# Patient Record
Sex: Male | Born: 1975 | Race: Black or African American | Hispanic: No | Marital: Single | Smoking: Current every day smoker
Health system: Southern US, Community
[De-identification: ages and names within clinical notes are randomized; demographics above are authoritative.]

---

## 2016-03-21 ENCOUNTER — Encounter (HOSPITAL_COMMUNITY): Payer: Self-pay | Admitting: Emergency Medicine

## 2016-03-21 ENCOUNTER — Emergency Department (HOSPITAL_COMMUNITY)
Admission: EM | Admit: 2016-03-21 | Discharge: 2016-03-21 | Disposition: A | Discharge status: Home / Self Care | Payer: Self-pay | Attending: Emergency Medicine | Admitting: Emergency Medicine

## 2016-03-21 DIAGNOSIS — Z113 Encounter for screening for infections with a predominantly sexual mode of transmission: Secondary | ICD-10-CM | POA: Insufficient documentation

## 2016-03-21 DIAGNOSIS — R197 Diarrhea, unspecified: Secondary | ICD-10-CM | POA: Insufficient documentation

## 2016-03-21 DIAGNOSIS — F17200 Nicotine dependence, unspecified, uncomplicated: Secondary | ICD-10-CM | POA: Insufficient documentation

## 2016-03-21 DIAGNOSIS — A64 Unspecified sexually transmitted disease: Secondary | ICD-10-CM

## 2016-03-21 DIAGNOSIS — R350 Frequency of micturition: Secondary | ICD-10-CM | POA: Insufficient documentation

## 2016-03-21 LAB — URINALYSIS, ROUTINE W REFLEX MICROSCOPIC
BILIRUBIN URINE: NEGATIVE
Glucose, UA: NEGATIVE mg/dL
Hgb urine dipstick: NEGATIVE
KETONES UR: NEGATIVE mg/dL
LEUKOCYTES UA: NEGATIVE
NITRITE: NEGATIVE
PH: 6 (ref 5.0–8.0)
PROTEIN: NEGATIVE mg/dL
Specific Gravity, Urine: 1.025 (ref 1.005–1.030)

## 2016-03-21 NOTE — ED Triage Notes (Signed)
Pt wants to be checked for STD. Found wife cheating on him a few days ago. Has had penile discharge and dripping for the past week.

## 2016-03-21 NOTE — Discharge Instructions (Signed)
We will call you to let you know the results of your testing today.

## 2016-03-21 NOTE — ED Provider Notes (Signed)
[facility]-EMERGENCY DEPT Provider Note   CSN: [id number] Arrival date & time: 03/21/16  0915  History   Chief Complaint Chief Complaint  Patient presents with  . Urinary Frequency    HPI Benjamin Roberson is a 40 y.o. male.  Pt w/ uncomplicated PMH presents for STD testing after reporting that his "wife cheated." He denies any rash, discharge, pain, or swelling of his penis or perineum. He does endorse some increased urinary frequency, and reports urine is a dark "brownish red." Denies any burning/dysuria. No h/o STD. Was previously sexually active with his wife only, no MSM. Denies CP, SOB, Abd pain, constipation. Does endorse 3 day h/o loose watery stool, no blood in the stool.      History reviewed. No pertinent past medical history.  There are no active problems to display for this patient.   History reviewed. No pertinent surgical history.     Home Medications    Prior to Admission medications   Not on File    Family History History reviewed. No pertinent family history.  Social History Social History  Substance Use Topics  . Smoking status: Current Every Day Smoker  . Smokeless tobacco: Never Used  . Alcohol use No     Allergies   Review of patient's allergies indicates no known allergies.   Review of Systems Review of Systems  Constitutional: Negative for chills and fever.  HENT: Negative for congestion, ear pain and sore throat.   Eyes: Negative for pain and visual disturbance.  Respiratory: Negative for cough and shortness of breath.   Cardiovascular: Negative for chest pain and palpitations.  Gastrointestinal: Positive for diarrhea. Negative for abdominal pain, blood in stool, nausea and vomiting.  Genitourinary: Positive for frequency. Negative for difficulty urinating, discharge, dysuria, flank pain, genital sores, hematuria, penile pain, penile swelling, scrotal swelling, testicular pain and urgency.  Musculoskeletal: Negative for arthralgias, back  pain and myalgias.  Skin: Negative for color change and rash.  Neurological: Negative for dizziness, seizures, syncope and light-headedness.  All other systems reviewed and are negative.    Physical Exam Updated Vital Signs BP 128/79   Pulse 93   Temp 98 F (36.7 C) (Oral)   Resp 16   SpO2 97%   Physical Exam  Constitutional: He appears well-developed and well-nourished. No distress.  HENT:  Head: Normocephalic and atraumatic.  Eyes: Conjunctivae are normal.  Neck: Neck supple.  Cardiovascular: Intact distal pulses.   Pulmonary/Chest: Effort normal. No respiratory distress.  Abdominal: Soft. There is no tenderness.  Genitourinary: Testes normal and penis normal. Right testis shows no mass, no swelling and no tenderness. Left testis shows no mass, no swelling and no tenderness. Uncircumcised. No phimosis, hypospadias, penile erythema or penile tenderness. No discharge found.  Musculoskeletal: He exhibits no edema.  Neurological: He is alert.  Skin: Skin is warm and dry.  Psychiatric: He has a normal mood and affect.  Nursing note and vitals reviewed.    ED Treatments / Results  Labs (all labs ordered are listed, but only abnormal results are displayed) Labs Reviewed  URINALYSIS, ROUTINE W REFLEX MICROSCOPIC (NOT AT [facility])  HIV ANTIBODY (ROUTINE TESTING)  RPR  GC/CHLAMYDIA PROBE AMP ([place]) NOT AT [facility]    EKG  EKG Interpretation None       Radiology No results found.  Procedures Procedures (including critical care time)  Medications Ordered in ED Medications - No data to display   Initial Impression / Assessment and Plan / ED Course  I have reviewed  the triage vital signs and the nursing notes.  Pertinent labs & imaging results that were available during my care of the patient were reviewed by me and considered in my medical decision making (see chart for details).  Clinical Course  Comment By Time  Pt seen and evaluated, will proceed with  routine STI testing and UA to r/o UTI. Carolynn CommentBryan Venba Zenner, MD 09/06 1100   Patient presents without significant symptoms of infection, no known exposure but concern for STI. We'll get routine STI testing. UA before UTI given increased urinary frequency and change in urine color.  Final Clinical Impressions(s) / ED Diagnoses   Final diagnoses:  STI testing  Urinary frequency    New Prescriptions New Prescriptions   No medications on file     Carolynn CommentBryan Akari Crysler, MD 03/21/16 1132    Linwood DibblesJon Knapp, MD 03/22/16 [phone]

## 2016-03-22 LAB — GC/CHLAMYDIA PROBE AMP (~~LOC~~) NOT AT ARMC
Chlamydia: NEGATIVE
NEISSERIA GONORRHEA: NEGATIVE

## 2016-03-22 LAB — RPR: RPR: NONREACTIVE

## 2016-03-22 LAB — HIV ANTIBODY (ROUTINE TESTING W REFLEX): HIV SCREEN 4TH GENERATION: NONREACTIVE

## 2020-11-17 ENCOUNTER — Other Ambulatory Visit: Payer: Self-pay

## 2020-11-17 ENCOUNTER — Emergency Department (HOSPITAL_COMMUNITY): Payer: Self-pay

## 2020-11-17 ENCOUNTER — Emergency Department (HOSPITAL_COMMUNITY)
Admission: EM | Admit: 2020-11-17 | Discharge: 2020-11-17 | Disposition: A | Discharge status: Home / Self Care | Payer: Self-pay | Attending: Emergency Medicine | Admitting: Emergency Medicine

## 2020-11-17 ENCOUNTER — Encounter (HOSPITAL_COMMUNITY): Payer: Self-pay | Admitting: *Deleted

## 2020-11-17 DIAGNOSIS — N3001 Acute cystitis with hematuria: Secondary | ICD-10-CM | POA: Insufficient documentation

## 2020-11-17 DIAGNOSIS — N200 Calculus of kidney: Secondary | ICD-10-CM | POA: Insufficient documentation

## 2020-11-17 DIAGNOSIS — R369 Urethral discharge, unspecified: Secondary | ICD-10-CM | POA: Insufficient documentation

## 2020-11-17 DIAGNOSIS — D72829 Elevated white blood cell count, unspecified: Secondary | ICD-10-CM | POA: Insufficient documentation

## 2020-11-17 DIAGNOSIS — F17200 Nicotine dependence, unspecified, uncomplicated: Secondary | ICD-10-CM | POA: Insufficient documentation

## 2020-11-17 LAB — URINALYSIS, ROUTINE W REFLEX MICROSCOPIC
Bilirubin Urine: NEGATIVE
Glucose, UA: NEGATIVE mg/dL
Ketones, ur: 5 mg/dL — AB
Nitrite: POSITIVE — AB
Protein, ur: 100 mg/dL — AB
Specific Gravity, Urine: 1.025 (ref 1.005–1.030)
WBC, UA: >50 WBC/hpf — ABNORMAL HIGH (ref 0–5)
pH: 6 (ref 5.0–8.0)

## 2020-11-17 LAB — CBC WITH DIFFERENTIAL/PLATELET
Abs Immature Granulocytes: 0.01 10*3/uL (ref 0.00–0.07)
Basophils Absolute: 0 10*3/uL (ref 0.0–0.1)
Basophils Relative: 0 %
Eosinophils Absolute: 0.2 10*3/uL (ref 0.0–0.5)
Eosinophils Relative: 2 %
HCT: 40.9 % (ref 39.0–52.0)
Hemoglobin: 13.2 g/dL (ref 13.0–17.0)
Immature Granulocytes: 0 %
Lymphocytes Relative: 28 %
Lymphs Abs: 2.5 10*3/uL (ref 0.7–4.0)
MCH: 30.8 pg (ref 26.0–34.0)
MCHC: 32.3 g/dL (ref 30.0–36.0)
MCV: 95.6 fL (ref 80.0–100.0)
Monocytes Absolute: 0.8 10*3/uL (ref 0.1–1.0)
Monocytes Relative: 8 %
Neutro Abs: 5.5 10*3/uL (ref 1.7–7.7)
Neutrophils Relative %: 62 %
Platelets: 259 10*3/uL (ref 150–400)
RBC: 4.28 MIL/uL (ref 4.22–5.81)
RDW: 13.7 % (ref 11.5–15.5)
WBC: 8.9 10*3/uL (ref 4.0–10.5)
nRBC: 0 % (ref 0.0–0.2)

## 2020-11-17 LAB — COMPREHENSIVE METABOLIC PANEL
ALT: 16 U/L (ref 0–44)
AST: 16 U/L (ref 15–41)
Albumin: 3.5 g/dL (ref 3.5–5.0)
Alkaline Phosphatase: 54 U/L (ref 38–126)
Anion gap: 6 (ref 5–15)
BUN: 9 mg/dL (ref 6–20)
CO2: 28 mmol/L (ref 22–32)
Calcium: 8.8 mg/dL — ABNORMAL LOW (ref 8.9–10.3)
Chloride: 106 mmol/L (ref 98–111)
Creatinine, Ser: 0.8 mg/dL (ref 0.61–1.24)
GFR, Estimated: >60 mL/min (ref >60)
Glucose, Bld: 96 mg/dL (ref 70–99)
Potassium: 3.3 mmol/L — ABNORMAL LOW (ref 3.5–5.1)
Sodium: 140 mmol/L (ref 135–145)
Total Bilirubin: 0.7 mg/dL (ref 0.3–1.2)
Total Protein: 6.4 g/dL — ABNORMAL LOW (ref 6.5–8.1)

## 2020-11-17 MED ORDER — LIDOCAINE HCL (PF) 1 % IJ SOLN
INTRAMUSCULAR | Status: AC
  Administered 2020-11-17: 5 mL
  Filled 2020-11-17: qty 5

## 2020-11-17 MED ORDER — CEFTRIAXONE SODIUM 500 MG IJ SOLR
500.0000 mg | Freq: Once | INTRAMUSCULAR | Status: AC
  Administered 2020-11-17: 500 mg via INTRAMUSCULAR
  Filled 2020-11-17: qty 500

## 2020-11-17 MED ORDER — CEPHALEXIN 500 MG PO CAPS: 500.0000 mg | ORAL_CAPSULE | Freq: Four times a day (QID) | ORAL | 0 refills | Status: AC

## 2020-11-17 NOTE — Discharge Instructions (Addendum)
I am prescribing you an antibiotic called Keflex.  Please take this 4 times a day for the next 10 days.  Again, do not stop taking this medication early. This will help treat your UTI.  Attached is information with a local urologist in [place].  I would recommend you follow-up with them.  It is not common for men to get urinary tract infections.  We have also obtained gonorrhea/chlamydia test in the emergency department.  Please check these results on MyChart.  They should be contacting you if these results are positive.  I have given you a shot of Rocephin in the emergency department which will help with your UTI as well as gonorrhea.  So, if you are only positive for gonorrhea then know that you have already been treated for this.  If you find that you are positive for chlamydia, you will need to return to the emergency department, urgent care, or see your regular doctor to be treated for this.  Please return to the emergency department with any new or worsening symptoms.  It was a pleasure to meet you.

## 2020-11-17 NOTE — ED Provider Notes (Signed)
[facility] EMERGENCY DEPARTMENT Provider Note   CSN: [id number] Arrival date & time: 11/17/20  1327     History No chief complaint on file.   Benjamin Roberson is a 45 y.o. male.  HPI Patient is a 44 year old male who presents the emergency department due to dysuria.  Patient states his symptoms started about 3 days ago.  Also reports a small amount of penile bleeding that occurs just after urination.  This morning he also noted some white penile discharge.  He denies any sexual activity for the past 5 months.  He states the last time he had a sexual partner was a male about 5 to 6 months ago.  He states he was using condoms during this encounter.  Reports a history of UTI.  Also reports history of kidney stones.  Patient reports associated right-sided abdominal pain.  No back pain.  No fevers, chest pain, shortness of breath, nausea, vomiting.    History reviewed. No pertinent past medical history.  There are no problems to display for this patient.   History reviewed. No pertinent surgical history.     No family history on file.  Social History   Tobacco Use  . Smoking status: Current Every Day Smoker  . Smokeless tobacco: Never Used  Substance Use Topics  . Alcohol use: No    Home Medications Prior to Admission medications   Medication Sig Start Date End Date Taking? Authorizing Provider  cephALEXin (KEFLEX) 500 MG capsule Take 1 capsule (500 mg total) by mouth 4 (four) times daily for 10 days. 11/17/20 11/27/20 Yes Placido Sou, PA-C    Allergies    Patient has no known allergies.  Review of Systems   Review of Systems  All other systems reviewed and are negative. Ten systems reviewed and are negative for acute change, except as noted in the HPI.   Physical Exam Updated Vital Signs BP 120/86   Pulse 76   Temp 99 F (37.2 C) (Oral)   Resp 16   Ht 5\' 10"  (1.778 m)   Wt 98.4 kg   SpO2 99%   BMI 31.14 kg/m   Physical Exam Vitals and nursing note  reviewed. Exam conducted with a chaperone present.  Constitutional:      General: He is not in acute distress.    Appearance: Normal appearance. He is normal weight. He is not ill-appearing, toxic-appearing or diaphoretic.  HENT:     Head: Normocephalic and atraumatic.     Right Ear: External ear normal.     Left Ear: External ear normal.     Nose: Nose normal.     Mouth/Throat:     Mouth: Mucous membranes are moist.     Pharynx: Oropharynx is clear. No oropharyngeal exudate or posterior oropharyngeal erythema.  Eyes:     General: No scleral icterus.       Right eye: No discharge.        Left eye: No discharge.     Extraocular Movements: Extraocular movements intact.     Conjunctiva/sclera: Conjunctivae normal.  Cardiovascular:     Rate and Rhythm: Normal rate and regular rhythm.     Pulses: Normal pulses.     Heart sounds: Normal heart sounds. No murmur heard. No friction rub. No gallop.   Pulmonary:     Effort: Pulmonary effort is normal. No respiratory distress.     Breath sounds: Normal breath sounds. No stridor. No wheezing, rhonchi or rales.  Abdominal:     General: Abdomen is  flat.     Palpations: Abdomen is soft.     Tenderness: There is right CVA tenderness. There is no left CVA tenderness.     Comments: Abdomen is flat and soft.  Mild right central lateral abdominal pain.  No flank pain.  Genitourinary:    Comments: Male chaperone present.  Normal-appearing uncircumcised penis.  Small amount of clear discharge noted at the urethral meatus.  No erythema.  No pain to palpation of the bilateral testicles or epididymis.  No tenderness along the penile shaft.  No growths or masses.  No rash. Musculoskeletal:        General: Normal range of motion.     Cervical back: Normal range of motion and neck supple. No tenderness.  Skin:    General: Skin is warm and dry.  Neurological:     General: No focal deficit present.     Mental Status: He is alert and oriented to person,  place, and time.  Psychiatric:        Mood and Affect: Mood normal.        Behavior: Behavior normal.    ED Results / Procedures / Treatments   Labs (all labs ordered are listed, but only abnormal results are displayed) Labs Reviewed  COMPREHENSIVE METABOLIC PANEL - Abnormal; Notable for the following components:      Result Value   Potassium 3.3 (*)    Calcium 8.8 (*)    Total Protein 6.4 (*)    All other components within normal limits  URINALYSIS, ROUTINE W REFLEX MICROSCOPIC - Abnormal; Notable for the following components:   APPearance HAZY (*)    Hgb urine dipstick SMALL (*)    Ketones, ur 5 (*)    Protein, ur 100 (*)    Nitrite POSITIVE (*)    Leukocytes,Ua LARGE (*)    WBC, UA >50 (*)    Bacteria, UA FEW (*)    All other components within normal limits  URINE CULTURE  CBC WITH DIFFERENTIAL/PLATELET  GC/CHLAMYDIA PROBE AMP ([place]) NOT AT [facility]   EKG None  Radiology CT Renal Stone Study  Result Date: 11/17/2020 CLINICAL DATA:  Lower abdominal pain, right flank pain and hematuria for 3 days. EXAM: CT ABDOMEN AND PELVIS WITHOUT CONTRAST TECHNIQUE: Multidetector CT imaging of the abdomen and pelvis was performed following the standard protocol without IV contrast. COMPARISON:  None. FINDINGS: Lower chest: Lung bases clear.  No pleural or pericardial effusion. Hepatobiliary: No focal liver abnormality is seen. No gallstones, gallbladder wall thickening, or biliary dilatation. Pancreas: Unremarkable. No pancreatic ductal dilatation or surrounding inflammatory changes. Spleen: Normal in size without focal abnormality. Adrenals/Urinary Tract: The adrenal glands appear normal. No hydronephrosis or ureteral stones are identified. 2-3 punctate nonobstructing stones are seen in the right kidney. No left renal stones are noted. The urinary bladder is almost completely decompressed. No stones are present in the bladder. Stomach/Bowel: Stomach is within normal limits. Appendix appears  normal. No evidence of bowel wall thickening, distention, or inflammatory changes. Vascular/Lymphatic: No significant vascular findings are present. No enlarged abdominal or pelvic lymph nodes. Reproductive: Prostate is unremarkable. Other: None. Musculoskeletal: No acute or focal abnormality IMPRESSION: 2-3 punctate nonobstructing right renal stones are identified. The exam is otherwise negative. No hydronephrosis or finding to explain hematuria. Electronically Signed   By: Drusilla Kanner M.D.   On: 11/17/2020 15:07    Procedures Procedures   Medications Ordered in ED Medications  cefTRIAXone (ROCEPHIN) injection 500 mg (500 mg Intramuscular Given 11/17/20 1559)  lidocaine (PF) (XYLOCAINE) 1 % injection (5 mLs  Given 11/17/20 1600)    ED Course  I have reviewed the triage vital signs and the nursing notes.  Pertinent labs & imaging results that were available during my care of the patient were reviewed by me and considered in my medical decision making (see chart for details).    MDM Rules/Calculators/A&P                          Pt is a 45 y.o. male who presents the emergency department due to dysuria, penile discharge, hematuria.  Labs: CBC without abnormalities. CMP with a potassium of 3.3, calcium of 8.8, total protein of 6.4. UA shows small hemoglobin, 5 ketones, 100 protein, positive nitrites, large leukocytes, greater than 50 white blood cells, few bacteria, white blood cell clumps, mucus. Urine culture sent. GC/chlamydia probe sent.  Imaging: CT scan of the abdomen and pelvis shows 2-3 punctate nonobstructing right renal stones.  Exam is otherwise negative.  No hydronephrosis.  I, Placido Sou, PA-C, personally reviewed and evaluated these images and lab results as part of my medical decision-making.  UA consistent with acute cystitis.  Patient afebrile.  Not tachycardic.  No flank pain on my exam.  No suprapubic pain.  He did have a mild amount of right-sided central  abdominal pain.  CT scan was obtained which was reassuring.  Patient states he has not been sexually active for more than 5 months and was using condoms with his prior encounters.  Denies any possibility of STI.  Recommended testing for GC/chlamydia and he was amenable.  This is pending.  Patient given a dose of IM Rocephin here in the emergency department.  Will discharge on a course of Keflex.  Feel that he is stable for discharge at this time and he is agreeable.  Recommended checking the results of his gonorrhea/chlamydia test on MyChart.  He understands that Rocephin should cover for both his UTI as well as possible gonorrhea.  If positive for chlamydia, he knows that he will need to return for treatment.  Patient also given a referral for follow-up with urology for reevaluation.  His questions were answered and he was amicable to time of discharge.  Note: Portions of this report may have been transcribed using voice recognition software. Every effort was made to ensure accuracy; however, inadvertent computerized transcription errors may be present.    Final Clinical Impression(s) / ED Diagnoses Final diagnoses:  Acute cystitis with hematuria   Rx / DC Orders ED Discharge Orders         Ordered    cephALEXin (KEFLEX) 500 MG capsule  4 times daily        11/17/20 1548           Placido Sou, PA-C 11/17/20 1603    Bethann Berkshire, MD 11/17/20 1635

## 2020-11-17 NOTE — ED Triage Notes (Signed)
Blood in urine for 3 days

## 2020-11-18 LAB — GC/CHLAMYDIA PROBE AMP (~~LOC~~) NOT AT ARMC
Chlamydia: NEGATIVE
Comment: NEGATIVE
Comment: NORMAL
Neisseria Gonorrhea: NEGATIVE

## 2020-11-19 LAB — URINE CULTURE

## 2020-11-20 LAB — URINE CULTURE: Culture: >=100000 — AB

## 2020-11-21 ENCOUNTER — Telehealth: Payer: Self-pay | Admitting: Emergency Medicine

## 2020-11-21 NOTE — Progress Notes (Addendum)
ED Antimicrobial Stewardship Positive Culture Follow Up   Benjamin Roberson is an 45 y.o. male who presented to [facility] on 11/17/2020 with a chief complaint of dysuria x3 days.  Recent Results (from the past 720 hour(s))  Urine culture     Status: Abnormal   Collection Time: 11/17/20  2:18 PM   Specimen: Urine, Random  Result Value Ref Range Status   Specimen Description   Final    URINE, RANDOM Performed at [facility], [address], [place], [place] [address]    Special Requests   Final    NONE Performed at [facility], [address] [place], [place] [address]    Culture (A)  Final    >=100,000 COLONIES/mL ESCHERICHIA COLI Confirmed Extended Spectrum Beta-Lactamase Producer (ESBL).  In bloodstream infections from ESBL organisms, carbapenems are preferred over piperacillin/tazobactam. They are shown to have a lower risk of mortality.    Report Status 11/20/2020 FINAL  Final   Organism ID, Bacteria ESCHERICHIA COLI (A)  Final      Susceptibility   Escherichia coli - MIC*    AMPICILLIN >=32 RESISTANT Resistant     CEFAZOLIN >=64 RESISTANT Resistant     CEFEPIME >=32 RESISTANT Resistant     CEFTRIAXONE >=64 RESISTANT Resistant     CIPROFLOXACIN >=4 RESISTANT Resistant     GENTAMICIN >=16 RESISTANT Resistant     IMIPENEM <=0.25 SENSITIVE Sensitive     NITROFURANTOIN <=16 SENSITIVE Sensitive     TRIMETH/SULFA >=320 RESISTANT Resistant     AMPICILLIN/SULBACTAM >=32 RESISTANT Resistant     PIP/TAZO 8 SENSITIVE Sensitive     * >=100,000 COLONIES/mL ESCHERICHIA COLI    [x]  Treated with cefalexin, organism resistant to prescribed antimicrobial   New antibiotic prescription:Stop Keflex. Start fosfomycin 3g orally x1. Follow up with PCP is symptoms do not resolve for consideration of repeated dose.   ED Provider: , MD  Valeria Batman, PharmD Clinical Pharmacist  11/21/2020, 10:01 AM Clinical Pharmacist Monday - Friday phone -  [phone] Saturday - Sunday phone -  [phone]

## 2020-11-21 NOTE — Telephone Encounter (Signed)
Post ED Visit - Positive Culture Follow-up: Successful Patient Follow-Up  Culture assessed and recommendations reviewed by:  []  , Pharm.D. []  Enzo Bi, Pharm.D., BCPS AQ-ID []  , Pharm.D., BCPS []  Celedonio Miyamoto, .D., BCPS []  [place], .D., BCPS, AAHIVP []  Georgina Pillion, Pharm.D., BCPS, AAHIVP []  [address], PharmD, BCPS []  , PharmD, BCPS []  [place], PharmD, BCPS []  [place], PharmD PharmD  Positive urine culture  []  Patient discharged without antimicrobial prescription and treatment is now indicated [x]  Organism is resistant to prescribed ED discharge antimicrobial []  Patient with positive blood cultures  Changes discussed with ED provider: Dr [name] New antibiotic prescription d/c Keflex, start fosfomycin 3 gram po orally x 1, followup with PCP if symptoms do not resolve with one dose of fosfomycin of second dose  Attempting to contact patient   11/21/2020, 3:22 PM

## 2020-11-27 ENCOUNTER — Telehealth: Payer: Self-pay | Admitting: Emergency Medicine

## 2020-11-27 NOTE — Telephone Encounter (Signed)
Post ED Visit - Positive Culture Follow-up: Successful Patient Follow-Up  Culture assessed and recommendations reviewed by:  []  , Pharm.D. []  Enzo Bi, Pharm.D., BCPS AQ-ID []  , Pharm.D., BCPS []  Celedonio Miyamoto, .D., BCPS []  [place], .D., BCPS, AAHIVP []  Georgina Pillion, Pharm.D., BCPS, AAHIVP []  [address], PharmD, BCPS []  , PharmD, BCPS []  [place], PharmD, BCPS []  [place], PharmD  Positive urine culture  []  Patient discharged without antimicrobial prescription and treatment is now indicated []  Organism is resistant to prescribed ED discharge antimicrobial []  Patient with positive blood cultures  Changes discussed with ED provider: , MD New antibiotic prescription: Fosfomycin three gram for one dose. Called to [name] [facility] [name], ) [phone]  Contacted patient, date 11/27/2020, time 1500 Pt called after receiving letter. Instructed to stop Keflex, start Fosfomycin, and follow-up with PCP if symptoms do not resolve with one dose of Fosfomycin for consideration of second dose. Patient verbalized understanding.   Benjamin Roberson 11/27/2020, 4:53 PM

## 2022-10-19 IMAGING — CT CT RENAL STONE PROTOCOL
2 of 4 series · 16 of 46 positions shown, 18 images · non-contrast
Comparison: None.

CLINICAL DATA: Lower abdominal pain, right flank pain and hematuria
for 3 days.

EXAM:
CT ABDOMEN AND PELVIS WITHOUT CONTRAST
TECHNIQUE: Multidetector CT imaging of the abdomen and pelvis was performed
following the standard protocol without IV contrast.

[Series 2: axial st · axial · 0.94mm/px · z∈[+589,+1009]mm · 13 of 98 slices shown, 15 images]
[im 7/98  soft-tissue]
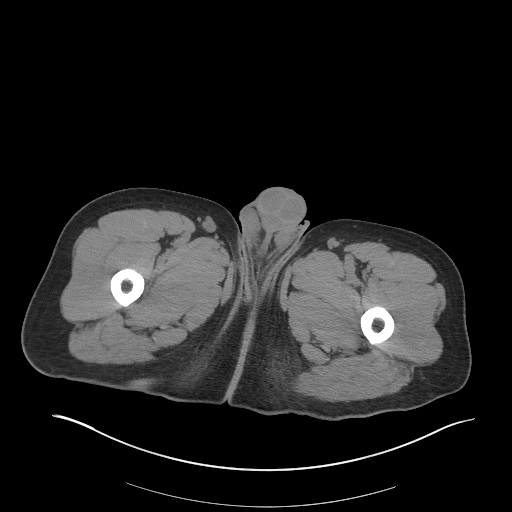
[im 7/98  bone]
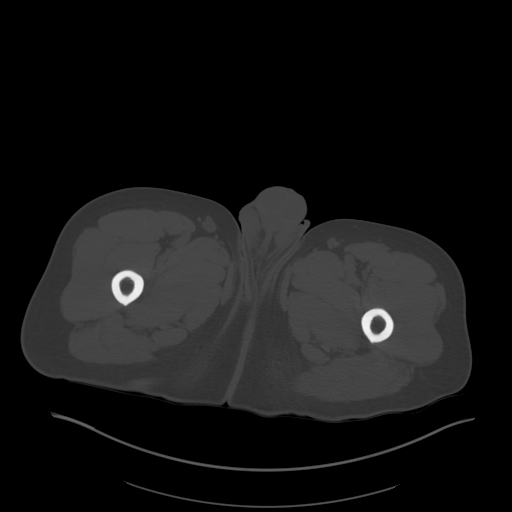
[im 13/98  soft-tissue]
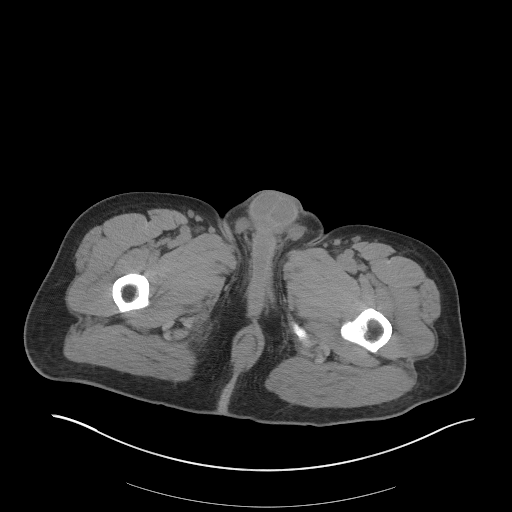
[im 19/98  soft-tissue]
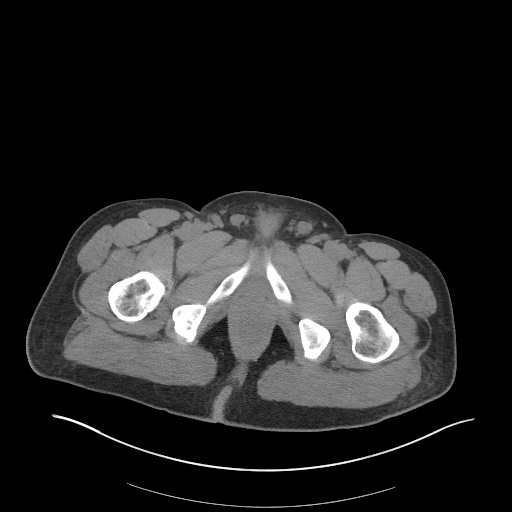
[im 31/98  soft-tissue]
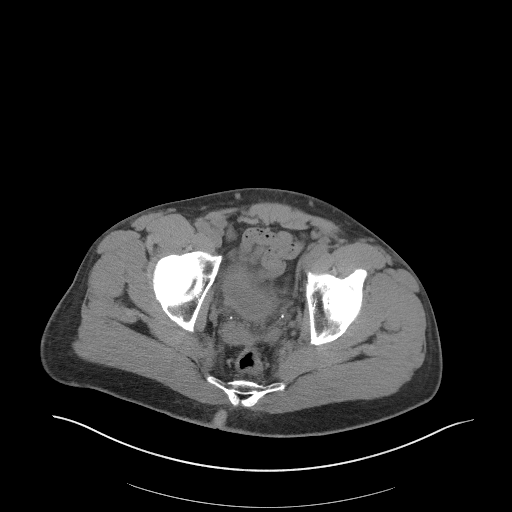
[im 37/98  soft-tissue]
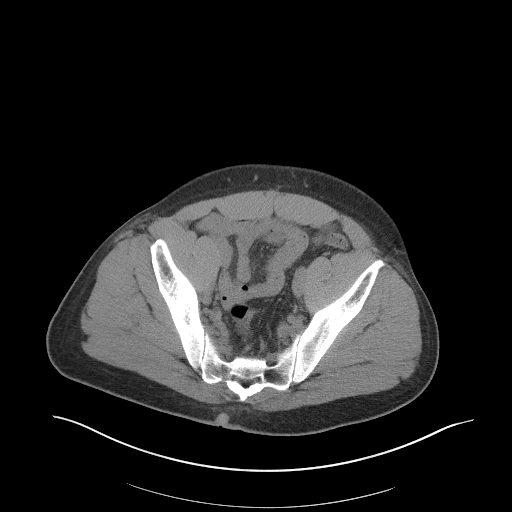
[im 43/98  soft-tissue]
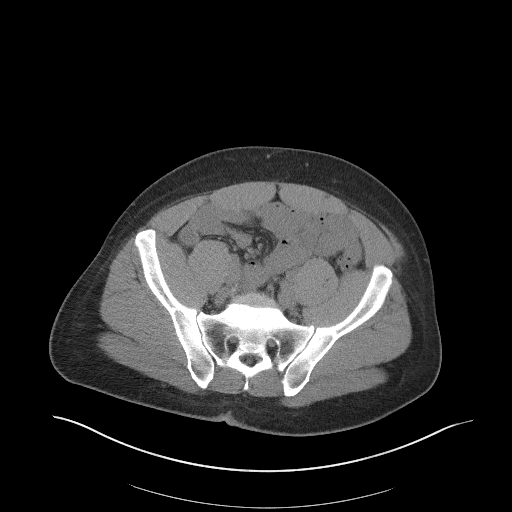
[im 49/98  soft-tissue]
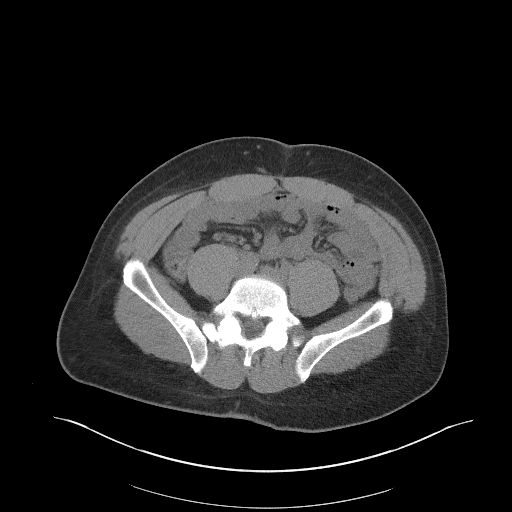
[im 55/98  soft-tissue]
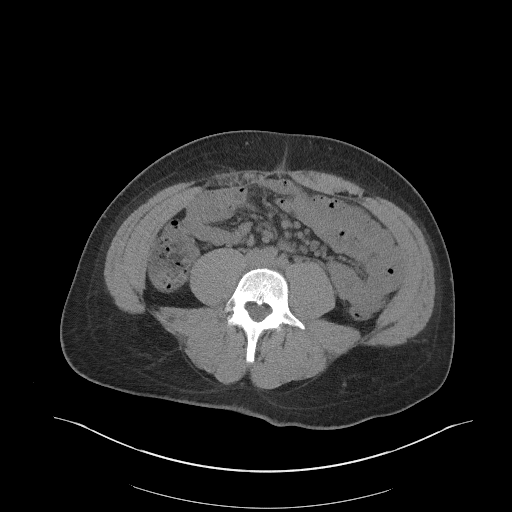
[im 61/98  soft-tissue]
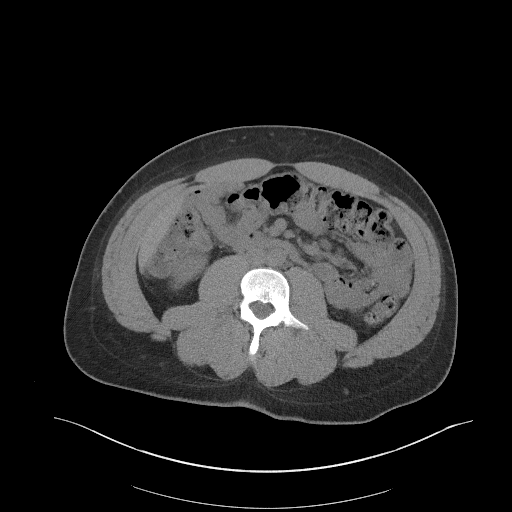
[im 61/98  bone]
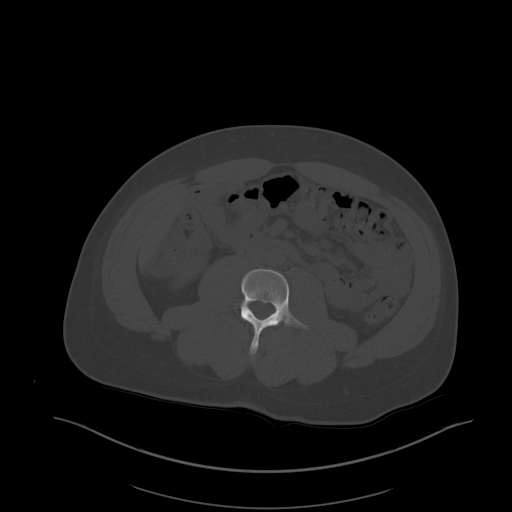
[im 67/98  soft-tissue]
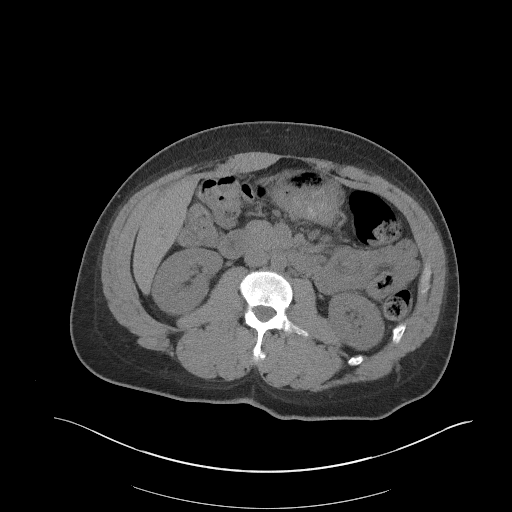
[im 79/98  soft-tissue]
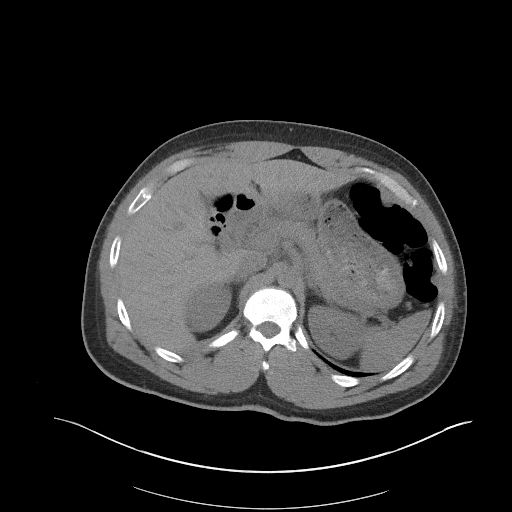
[im 85/98  soft-tissue]
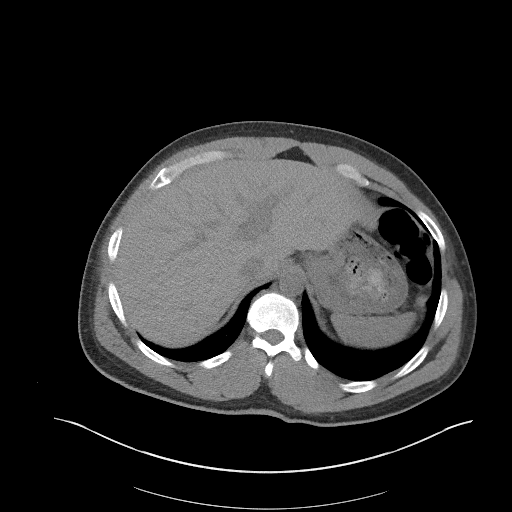
[im 91/98  soft-tissue]
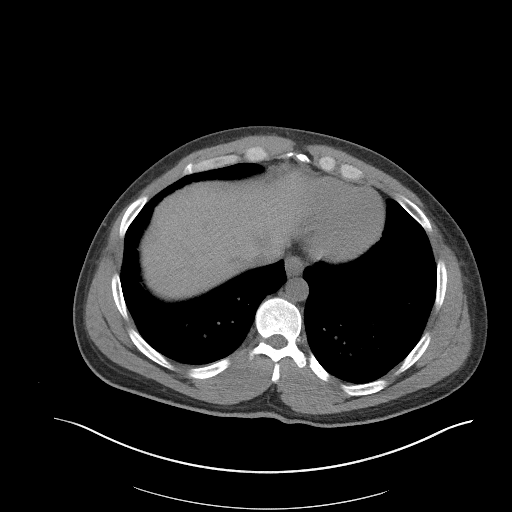

[Series 5: coronal st · coronal · 0.82mm/px · 3 of 106 slices shown]
[im 36/106  soft-tissue]
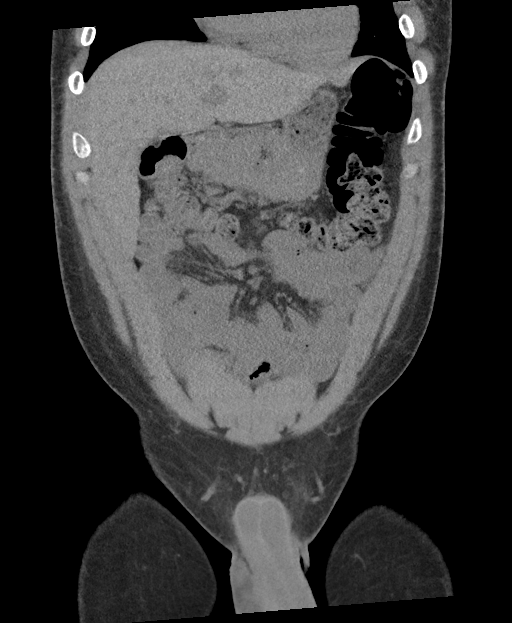
[im 47/106  soft-tissue]
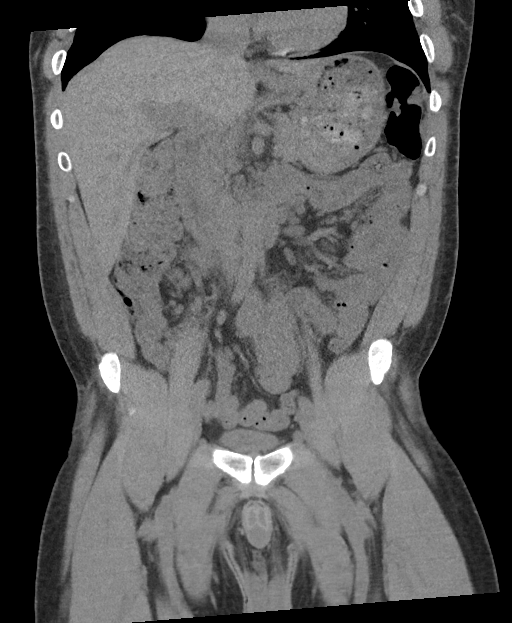
[im 59/106  soft-tissue]
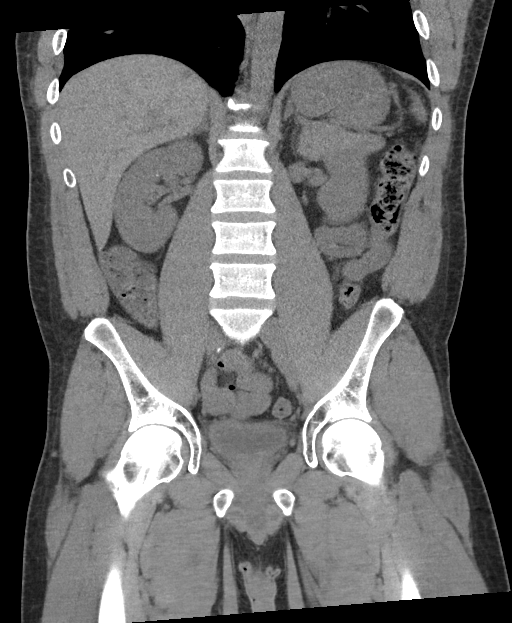

[16 of 46 positions shown; findings below may reference images not displayed]

FINDINGS: Lower chest: Lung bases clear.  No pleural or pericardial effusion.

Hepatobiliary: No focal liver abnormality is seen. No gallstones,
gallbladder wall thickening, or biliary dilatation.

Pancreas: Unremarkable. No pancreatic ductal dilatation or
surrounding inflammatory changes.

Spleen: Normal in size without focal abnormality.

Adrenals/Urinary Tract: The adrenal glands appear normal. No
hydronephrosis or ureteral stones are identified. 2-3 punctate
nonobstructing stones are seen in the right kidney. No left renal
stones are noted. The urinary bladder is almost completely
decompressed. No stones are present in the bladder.

Stomach/Bowel: Stomach is within normal limits. Appendix appears
normal. No evidence of bowel wall thickening, distention, or
inflammatory changes.

Vascular/Lymphatic: No significant vascular findings are present. No
enlarged abdominal or pelvic lymph nodes.

Reproductive: Prostate is unremarkable.

Other: None.

Musculoskeletal: No acute or focal abnormality
IMPRESSION: 2-3 punctate nonobstructing right renal stones are identified. The
exam is otherwise negative. No hydronephrosis or finding to explain
hematuria.
